# Patient Record
Sex: Female | Born: 1960 | Race: Black or African American | Hispanic: No | Marital: Single | Smoking: Never smoker
Health system: Southern US, Community
[De-identification: ages and names within clinical notes are randomized; demographics above are authoritative.]

## PROBLEM LIST (undated history)

## (undated) DIAGNOSIS — R011 Cardiac murmur, unspecified: Secondary | ICD-10-CM

## (undated) HISTORY — DX: Cardiac murmur, unspecified: R01.1

---

## 1982-03-29 HISTORY — PX: TUBAL LIGATION: SHX77

## 1997-03-29 HISTORY — PX: CARDIAC VALVE REPLACEMENT: SHX585

## 2006-02-15 ENCOUNTER — Other Ambulatory Visit: Admission: RE | Admit: 2006-02-15 | Discharge: 2006-02-15 | Payer: Self-pay | Admitting: Obstetrics and Gynecology

## 2006-03-01 ENCOUNTER — Ambulatory Visit (HOSPITAL_COMMUNITY): Admission: RE | Admit: 2006-03-01 | Discharge: 2006-03-01 | Payer: Self-pay | Admitting: Obstetrics and Gynecology

## 2006-03-14 ENCOUNTER — Encounter (INDEPENDENT_AMBULATORY_CARE_PROVIDER_SITE_OTHER): Payer: Self-pay | Admitting: *Deleted

## 2006-03-14 ENCOUNTER — Other Ambulatory Visit: Admission: RE | Admit: 2006-03-14 | Discharge: 2006-03-14 | Payer: Self-pay | Admitting: Interventional Radiology

## 2006-03-14 ENCOUNTER — Encounter: Admission: RE | Admit: 2006-03-14 | Discharge: 2006-03-14 | Payer: Self-pay | Admitting: Obstetrics and Gynecology

## 2006-10-11 ENCOUNTER — Encounter: Admission: RE | Admit: 2006-10-11 | Discharge: 2006-10-11 | Payer: Self-pay | Admitting: Endocrinology

## 2007-04-07 ENCOUNTER — Encounter: Admission: RE | Admit: 2007-04-07 | Discharge: 2007-04-07 | Payer: Self-pay | Admitting: Endocrinology

## 2007-06-30 ENCOUNTER — Emergency Department (HOSPITAL_COMMUNITY): Admission: EM | Admit: 2007-06-30 | Discharge: 2007-06-30 | Payer: Self-pay | Admitting: Family Medicine

## 2007-07-19 ENCOUNTER — Ambulatory Visit (HOSPITAL_COMMUNITY): Admission: RE | Admit: 2007-07-19 | Discharge: 2007-07-19 | Payer: Self-pay | Admitting: Orthopedic Surgery

## 2008-01-09 ENCOUNTER — Ambulatory Visit (HOSPITAL_COMMUNITY): Admission: RE | Admit: 2008-01-09 | Discharge: 2008-01-09 | Payer: Self-pay | Admitting: Obstetrics and Gynecology

## 2008-04-05 ENCOUNTER — Encounter: Admission: RE | Admit: 2008-04-05 | Discharge: 2008-04-05 | Payer: Self-pay | Admitting: Endocrinology

## 2008-12-30 ENCOUNTER — Ambulatory Visit (HOSPITAL_COMMUNITY): Admission: RE | Admit: 2008-12-30 | Discharge: 2008-12-30 | Payer: Self-pay | Admitting: Obstetrics and Gynecology

## 2008-12-30 ENCOUNTER — Encounter (INDEPENDENT_AMBULATORY_CARE_PROVIDER_SITE_OTHER): Payer: Self-pay | Admitting: Obstetrics and Gynecology

## 2009-01-09 ENCOUNTER — Ambulatory Visit (HOSPITAL_COMMUNITY): Admission: RE | Admit: 2009-01-09 | Discharge: 2009-01-09 | Payer: Self-pay | Admitting: Obstetrics and Gynecology

## 2009-08-20 IMAGING — US US SOFT TISSUE HEAD/NECK
1 series · 14 of 25 positions shown · non-contrast
Comparison: Ultrasound of 10/11/06.

CLINICAL DATA: Multinodular goiter. 
 THYROID ULTRASOUND:
TECHNIQUE: Ultrasound examination of the thyroid gland and adjacent soft tissue structures was performed.

[Series 1: us soft tissue head/neck · 0.07mm/px · 14 of 45 slices shown]
[im 1/45]
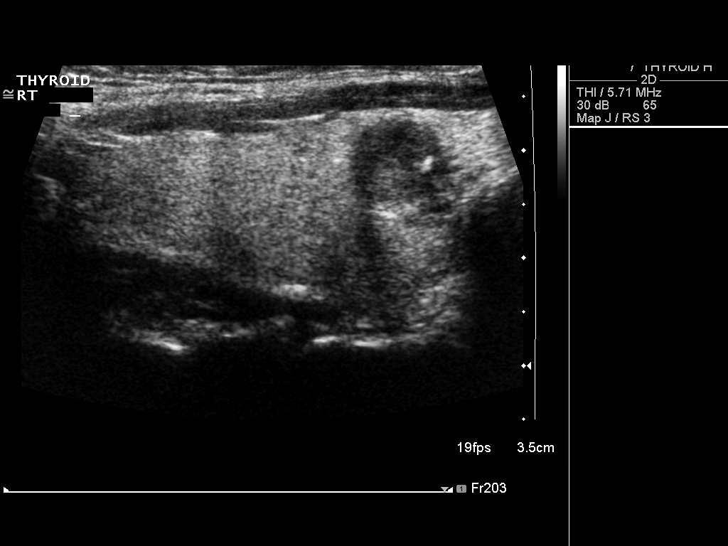
[im 4/45]
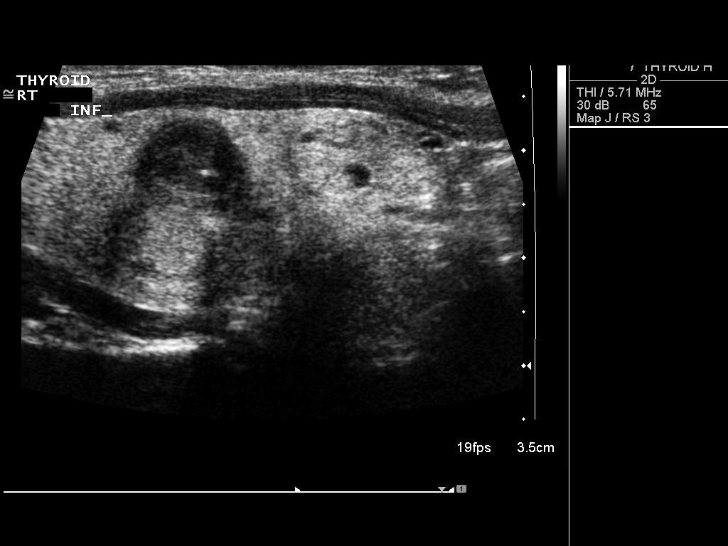
[im 8/45]
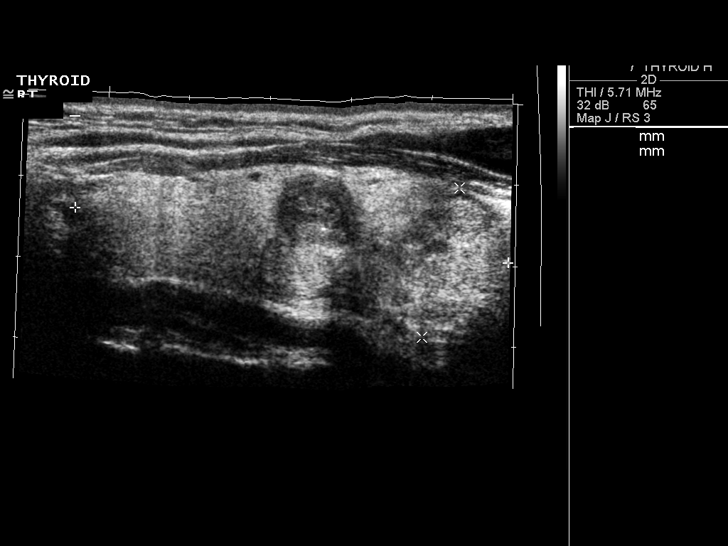
[im 12/45]
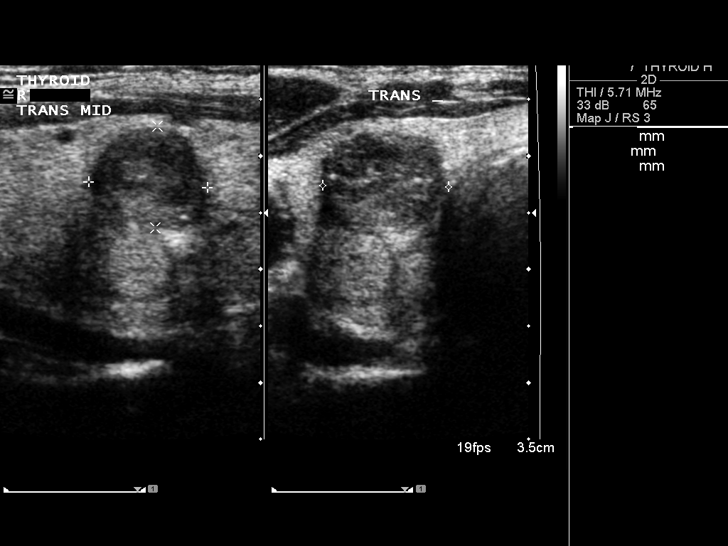
[im 15/45]
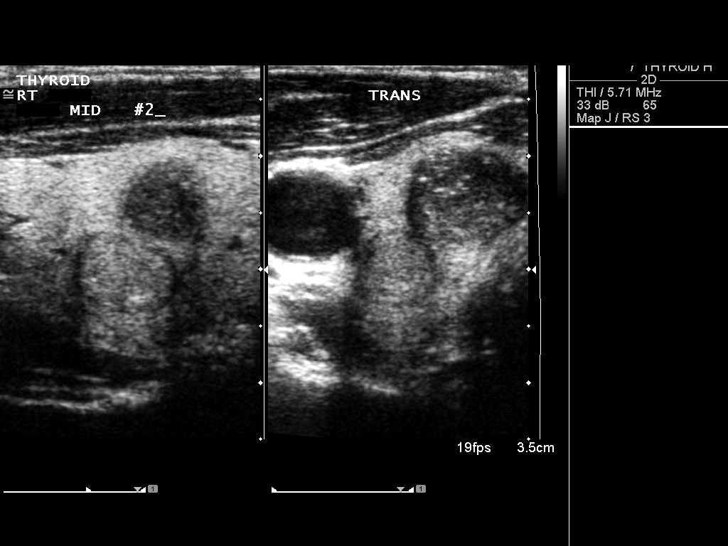
[im 17/45]
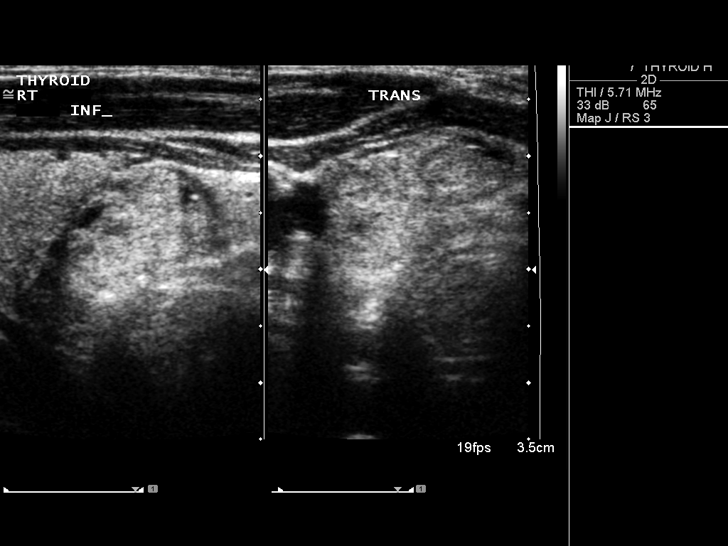
[im 21/45]
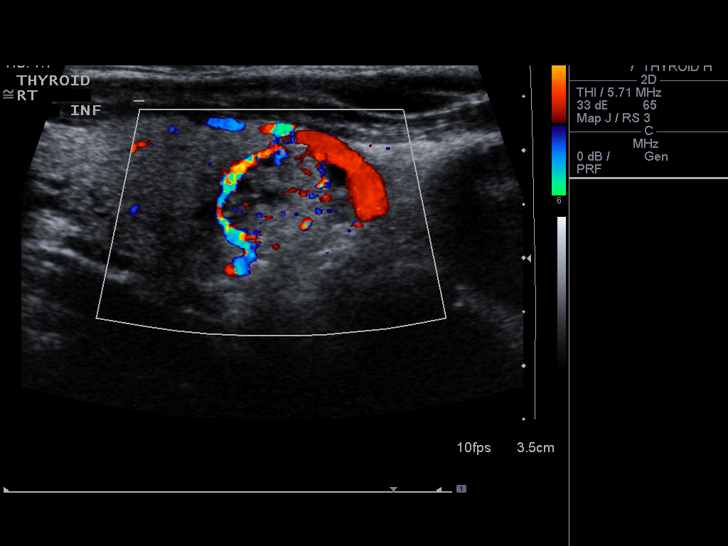
[im 24/45]
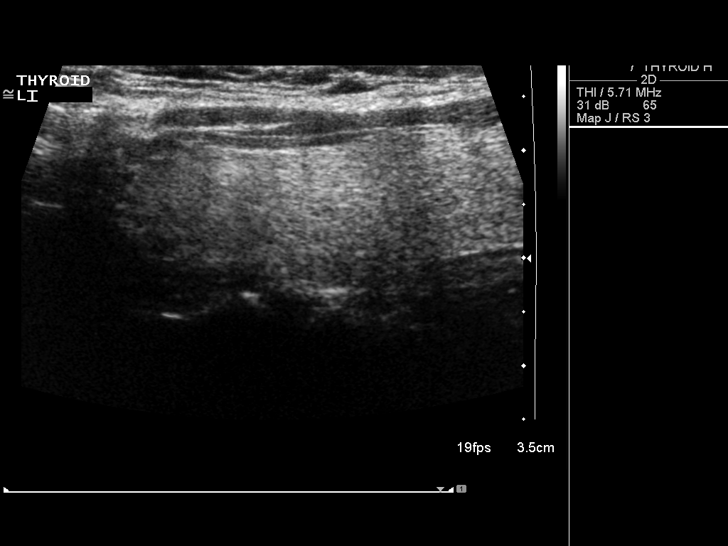
[im 28/45]
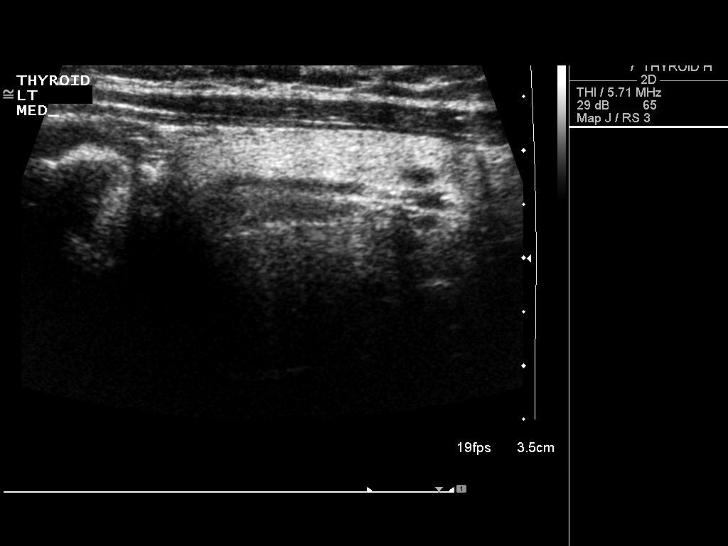
[im 30/45]
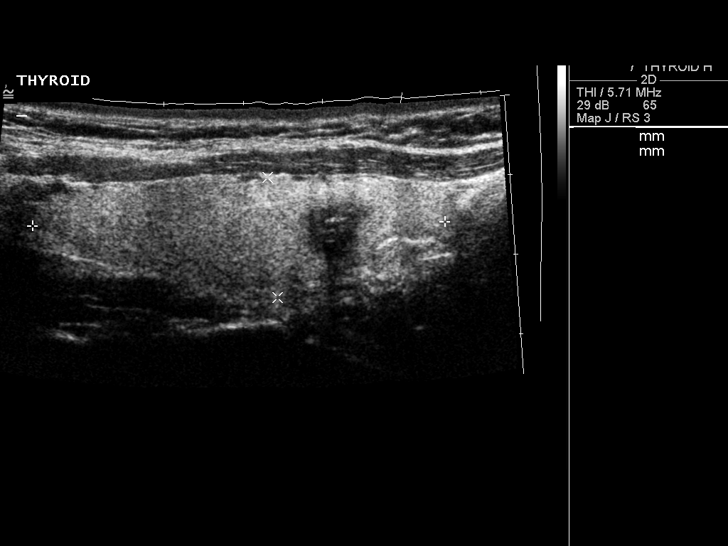
[im 34/45]
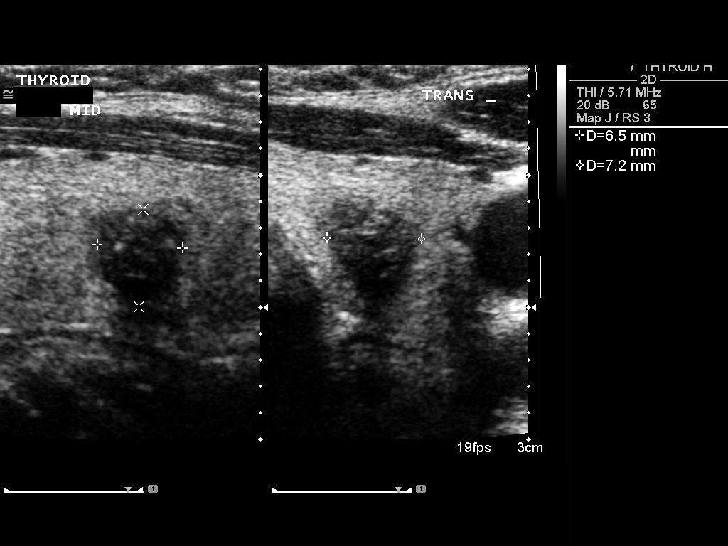
[im 37/45]
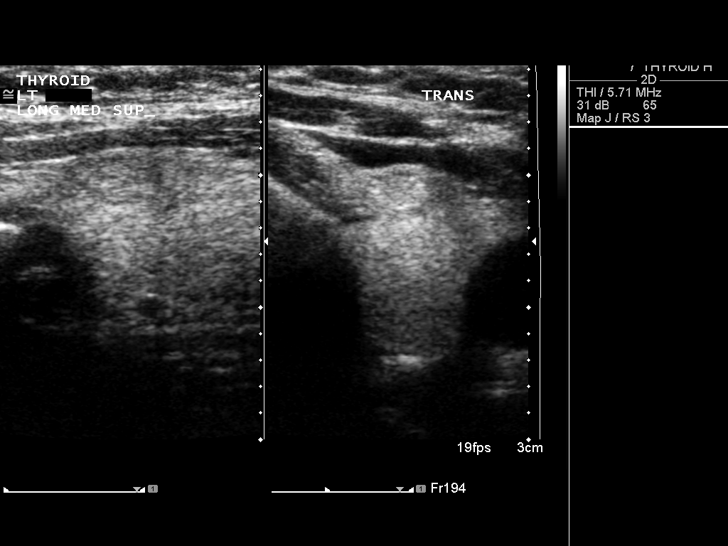
[im 41/45]
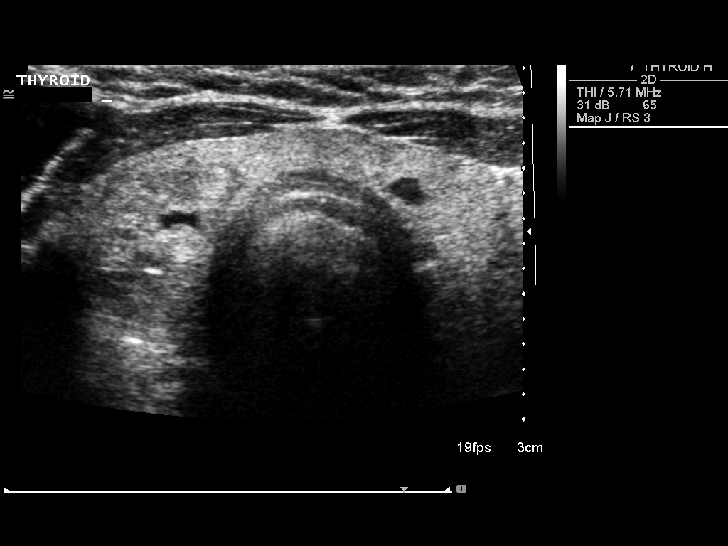
[im 45/45]
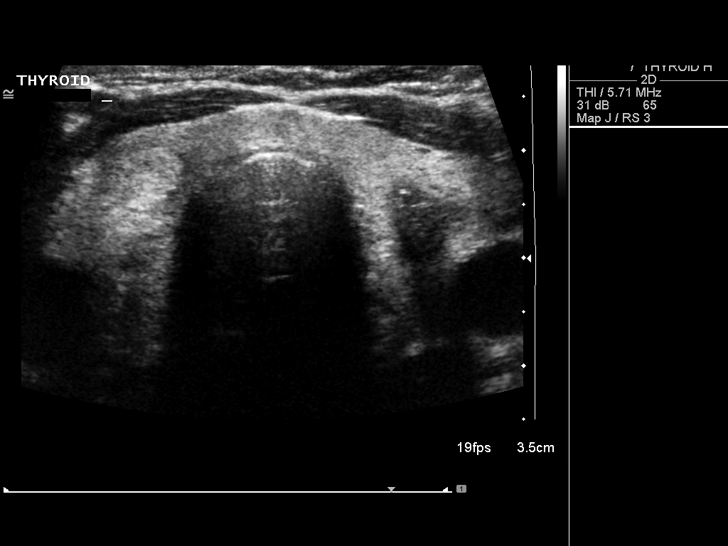

[14 of 25 positions shown; findings below may reference images not displayed]

FINDINGS: Thyroid gland is unchanged in size.  The right lobe measures 5.4 cm sagittally with a depth of 1.9 cm and width of 1.6 cm.  The left lobe measures 5.2 x 1.5 x 1.8 cm with the isthmus measuring 3 mm.  The gland is inhomogeneous and multiple nodules again are noted.  The largest nodule is noted to be within the lower pole on the right and appears slightly smaller measuring 1.4 x 0.9 x 0.9 cm.  A nodule medially in the lower pole on the left is basically unchanged measuring 1.6 x 0.7 x 1.1 cm.  Nodule in the mid lobe on the left is perhaps smaller measuring 1.1 x 0.9 x 1.1 cm.  A nodule in the midleft lobe is definitely smaller measuring 7 x 7 x 7 mm with a nodule in the upper pole on the left of 7 x 3 x 5 mm.
IMPRESSION: Nodules previously noted are perhaps slightly smaller.  No increasing nodule is seen.

## 2010-01-12 ENCOUNTER — Ambulatory Visit (HOSPITAL_COMMUNITY): Admission: RE | Admit: 2010-01-12 | Discharge: 2010-01-12 | Payer: Self-pay | Admitting: General Surgery

## 2010-01-12 ENCOUNTER — Ambulatory Visit (HOSPITAL_COMMUNITY): Admission: RE | Admit: 2010-01-12 | Discharge: 2010-01-12 | Payer: Self-pay | Admitting: Obstetrics and Gynecology

## 2010-04-19 ENCOUNTER — Encounter: Payer: Self-pay | Admitting: Endocrinology

## 2010-07-02 LAB — BASIC METABOLIC PANEL
BUN: 11 mg/dL (ref 6–23)
Calcium: 9.8 mg/dL (ref 8.4–10.5)
Chloride: 101 mEq/L (ref 96–112)
Creatinine, Ser: 0.89 mg/dL (ref 0.4–1.2)
GFR calc Af Amer: >60 mL/min (ref >60)
GFR calc non Af Amer: >60 mL/min (ref >60)

## 2010-07-02 LAB — CBC
MCV: 88.9 fL (ref 78.0–100.0)
Platelets: 243 10*3/uL (ref 150–400)
RBC: 4.65 MIL/uL (ref 3.87–5.11)
WBC: 4.2 10*3/uL (ref 4.0–10.5)

## 2010-07-02 LAB — PREGNANCY, URINE: Preg Test, Ur: NEGATIVE

## 2010-08-19 IMAGING — US US SOFT TISSUE HEAD/NECK
1 series · 14 of 25 positions shown · non-contrast
Comparison: Ultrasound soft tissue head neck of 04/07/2007

CLINICAL DATA: Follow up of thyroid nodules

THYROID ULTRASOUND
TECHNIQUE: Ultrasound examination of the thyroid gland and
adjacent soft tissues was performed.

[Series 1: us soft tissue head/neck · 0.08mm/px · 14 of 62 slices shown]
[im 1/62]
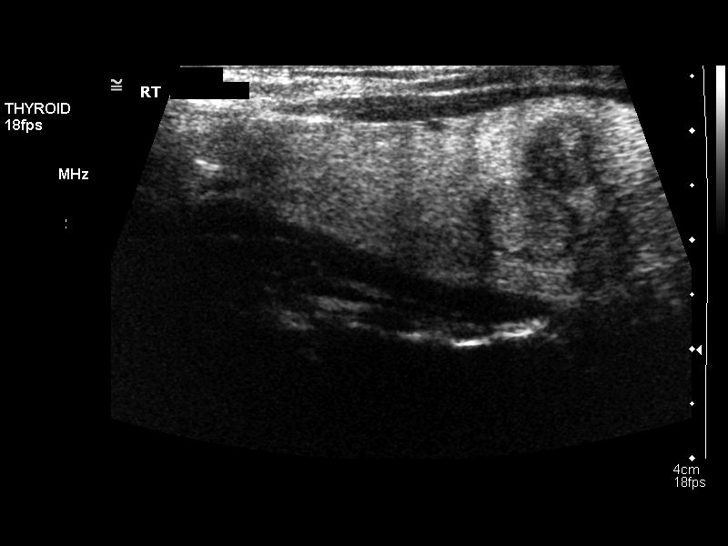
[im 6/62]
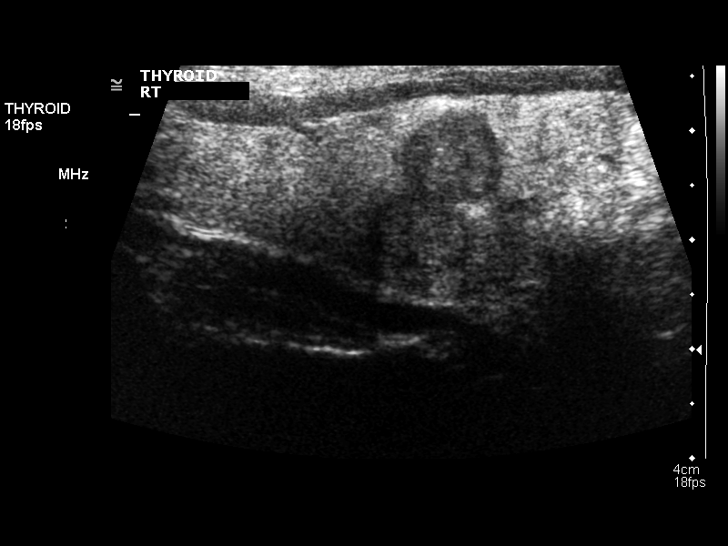
[im 11/62]
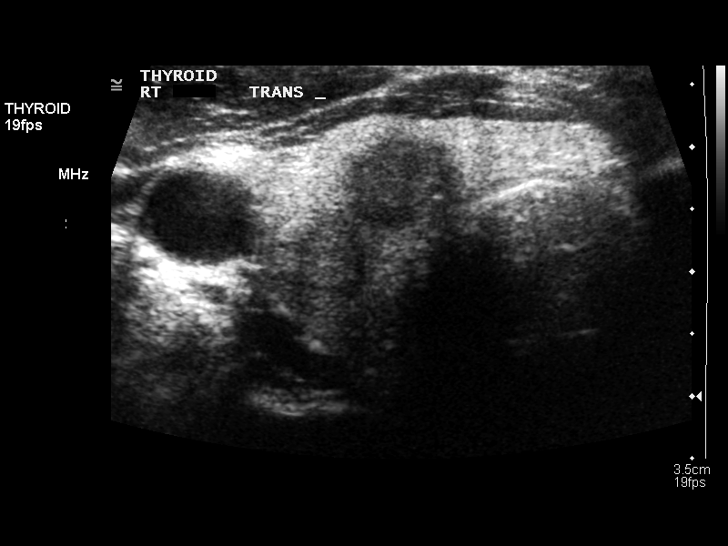
[im 16/62]
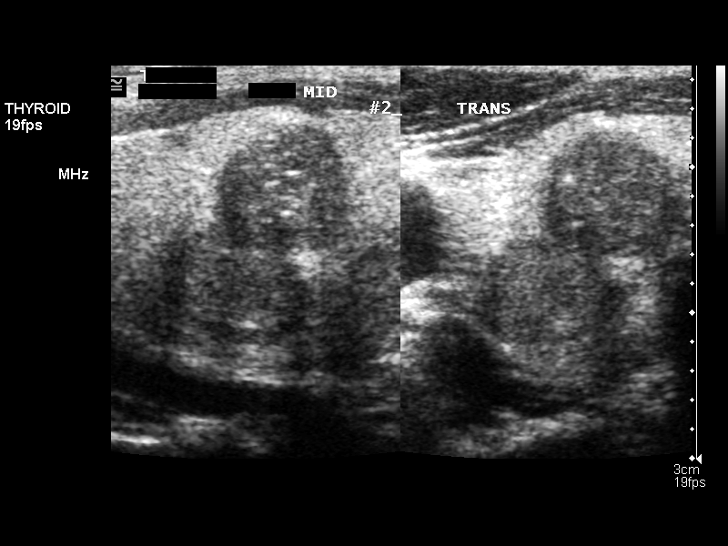
[im 21/62]
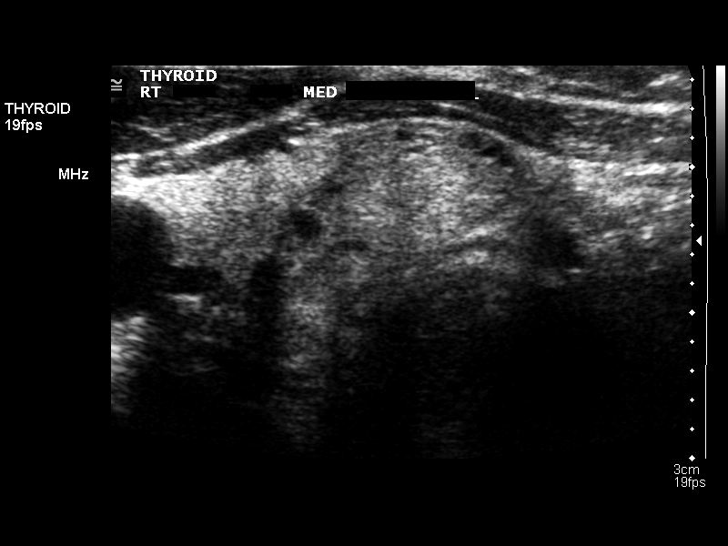
[im 23/62]
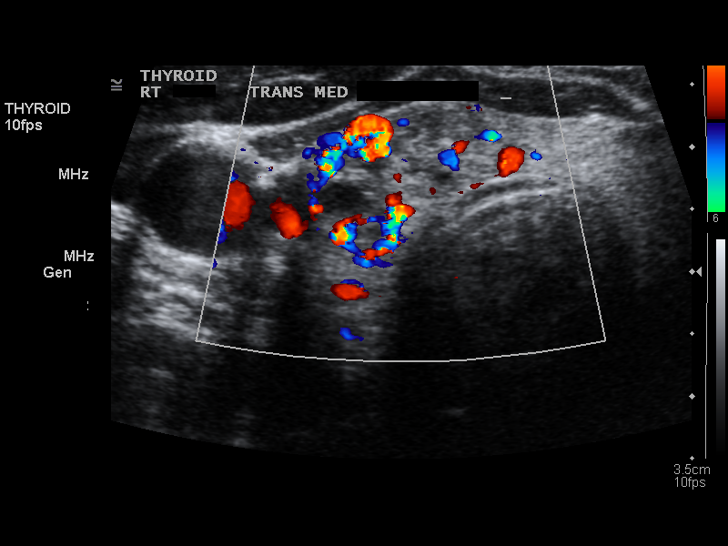
[im 28/62]
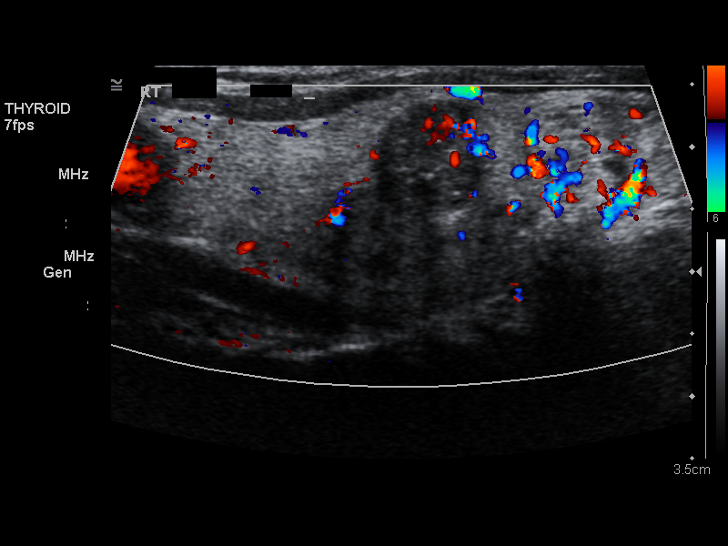
[im 34/62]
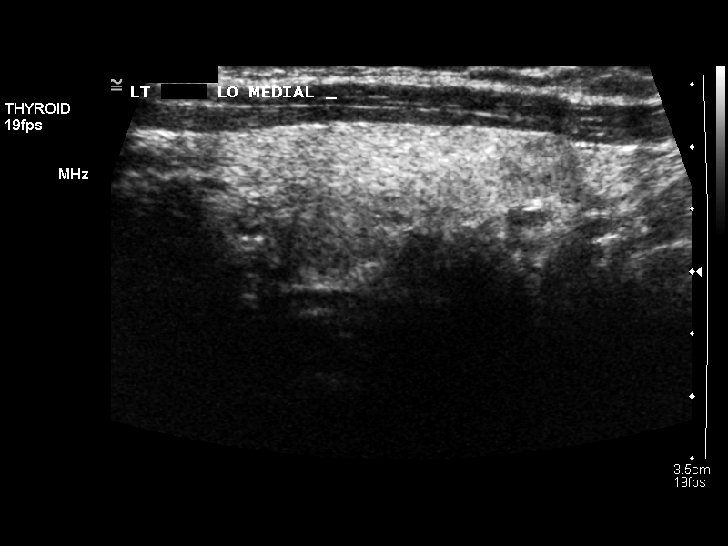
[im 39/62]
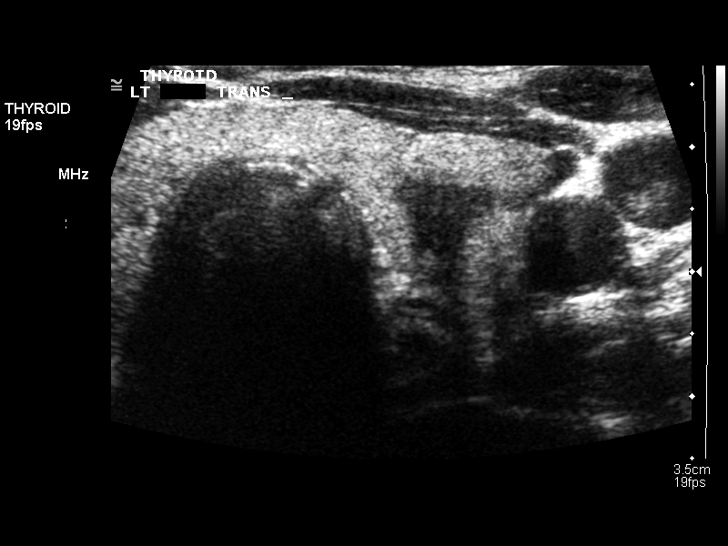
[im 41/62]
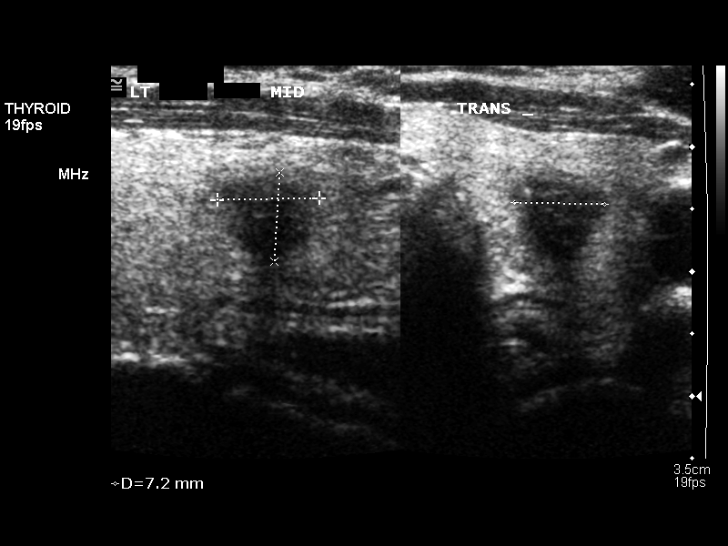
[im 46/62]
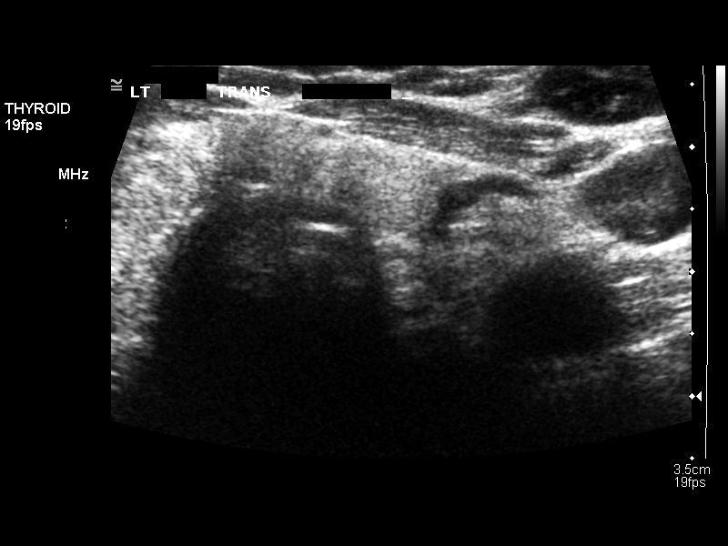
[im 51/62]
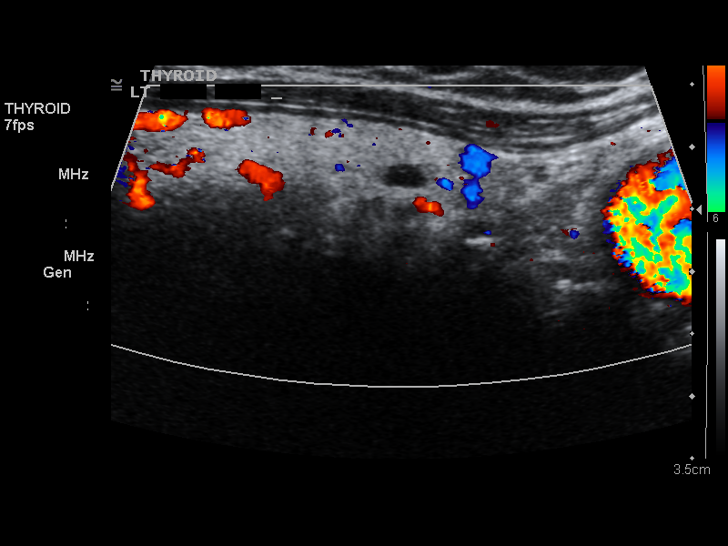
[im 56/62]
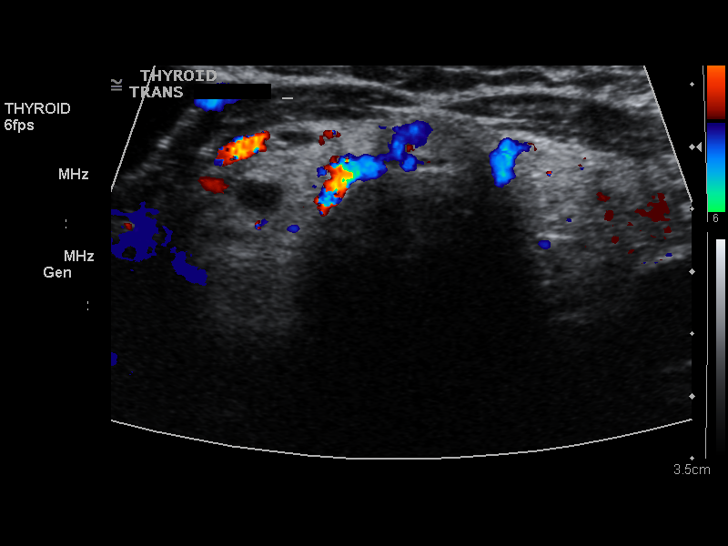
[im 62/62]
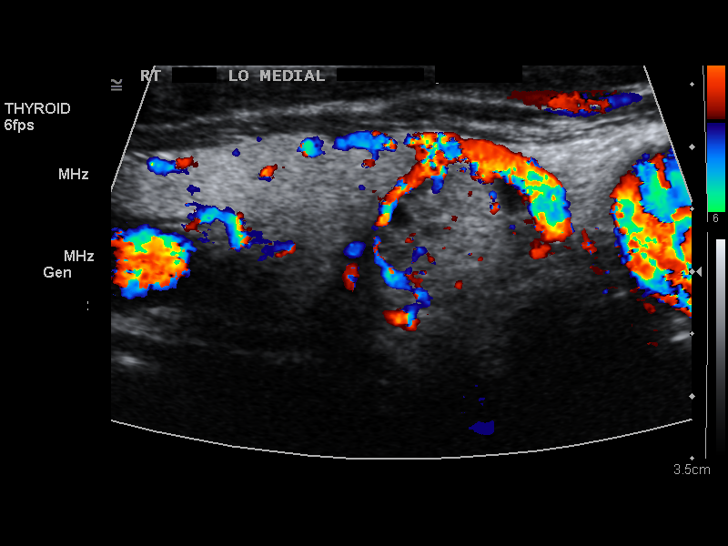

[14 of 25 positions shown; findings below may reference images not displayed]

FINDINGS: The thyroid gland is stable in size.  The right lobe
measures 5.7 cm sagittally, with a depth of 1.9 cm and width of
cm.  The left lobe measures 5.5 x 1.6 x 1.9 cm, with the isthmus
measuring 4 mm.  The gland is inhomogeneous and there are nodules
bilaterally.  The dominant nodule is in the lower pole on the right
and is solid measuring 1.8 x 0.9 x 1.2 cm with a second adjacent
medial nodule of 1.5 x 0.8 x 1.3 cm.  These nodules are relatively
stable compared to the prior ultrasound.  An additional nodule is
noted in the right mid lobe of 10 x 9 x 10 mm, and is unchanged.  A
nodule the left mid lobe measures 8 x 7 x 7 mm, previously
measuring 7 x 7 x 7 mm.  There are a few shadowing calcifications
within this nodule.
IMPRESSION: The previously noted thyroid nodules appear stable as noted above.
No new nodule is seen.

## 2011-01-28 ENCOUNTER — Other Ambulatory Visit (HOSPITAL_COMMUNITY): Payer: Self-pay | Admitting: Obstetrics and Gynecology

## 2011-01-28 DIAGNOSIS — Z1231 Encounter for screening mammogram for malignant neoplasm of breast: Secondary | ICD-10-CM

## 2011-02-02 ENCOUNTER — Ambulatory Visit (HOSPITAL_COMMUNITY)
Admission: RE | Admit: 2011-02-02 | Discharge: 2011-02-02 | Disposition: A | Discharge status: Home / Self Care | Payer: 59 | Source: Ambulatory Visit | Attending: Obstetrics and Gynecology | Admitting: Obstetrics and Gynecology

## 2011-02-02 DIAGNOSIS — Z1231 Encounter for screening mammogram for malignant neoplasm of breast: Secondary | ICD-10-CM | POA: Insufficient documentation

## 2011-02-03 ENCOUNTER — Other Ambulatory Visit: Payer: Self-pay | Admitting: Obstetrics and Gynecology

## 2011-02-03 DIAGNOSIS — R928 Other abnormal and inconclusive findings on diagnostic imaging of breast: Secondary | ICD-10-CM

## 2011-02-23 ENCOUNTER — Ambulatory Visit
Admission: RE | Admit: 2011-02-23 | Discharge: 2011-02-23 | Disposition: A | Discharge status: Home / Self Care | Payer: 59 | Source: Ambulatory Visit | Attending: Obstetrics and Gynecology | Admitting: Obstetrics and Gynecology

## 2011-02-23 DIAGNOSIS — R928 Other abnormal and inconclusive findings on diagnostic imaging of breast: Secondary | ICD-10-CM

## 2013-07-02 ENCOUNTER — Ambulatory Visit (INDEPENDENT_AMBULATORY_CARE_PROVIDER_SITE_OTHER): Payer: BC Managed Care – PPO | Admitting: Physician Assistant

## 2013-07-02 VITALS — BP 122/80 | HR 69 | Temp 97.8°F | Resp 18 | Ht 65.5 in | Wt 167.0 lb

## 2013-07-02 DIAGNOSIS — J329 Chronic sinusitis, unspecified: Secondary | ICD-10-CM

## 2013-07-02 DIAGNOSIS — R059 Cough, unspecified: Secondary | ICD-10-CM

## 2013-07-02 DIAGNOSIS — R05 Cough: Secondary | ICD-10-CM

## 2013-07-02 DIAGNOSIS — R0981 Nasal congestion: Secondary | ICD-10-CM

## 2013-07-02 DIAGNOSIS — J3489 Other specified disorders of nose and nasal sinuses: Secondary | ICD-10-CM

## 2013-07-02 MED ORDER — AMOXICILLIN-POT CLAVULANATE 875-125 MG PO TABS: 1.0000 | ORAL_TABLET | Freq: Two times a day (BID) | ORAL | Status: DC

## 2013-07-02 MED ORDER — GUAIFENESIN ER 1200 MG PO TB12: 1.0000 | ORAL_TABLET | Freq: Two times a day (BID) | ORAL | Status: DC | PRN

## 2013-07-02 MED ORDER — HYDROCOD POLST-CHLORPHEN POLST 10-8 MG/5ML PO LQCR: 5.0000 mL | Freq: Two times a day (BID) | ORAL | Status: DC | PRN

## 2013-07-02 NOTE — Progress Notes (Signed)
   Subjective:    Patient ID: Erin Paul, female    DOB: [birth year], 53 y.o.   MRN: [id number]  HPI 53 year old female presents for evaluation of 1 week history of nasal congestion, sinus pressure, facial pain, PND, and a productive cough. Symptoms have been progressively worsening despite treatment with OTC Nyquil and Theraflu which has not helped much.  Admits to facial pain with associated dental pain bilateral upper jaw.  Has thick brownish yellow nasal discharge and mucous.  Cough is productive of this sputum. No fever, chills, nausea, vomiting, chest pain, SOB, wheezing, otalgia, or sore throat.  Does have a lot of PND.   Patient is otherwise healthy with no other concerns today.  She works as an Charity fundraiserN at [employer].     Review of Systems  Constitutional: Negative for fever and chills.  HENT: Positive for congestion, postnasal drip, rhinorrhea and sinus pressure. Negative for ear pain and sore throat.   Respiratory: Positive for cough. Negative for chest tightness, shortness of breath and wheezing.   Cardiovascular: Negative for chest pain.  Gastrointestinal: Negative for nausea and vomiting.  Neurological: Negative for dizziness and headaches.       Objective:   Physical Exam  Constitutional: She is oriented to person, place, and time. She appears well-developed and well-nourished.  HENT:  Head: Normocephalic and atraumatic.  Right Ear: Hearing, tympanic membrane, external ear and ear canal normal.  Left Ear: Hearing, tympanic membrane, external ear and ear canal normal.  Nose: Right sinus exhibits maxillary sinus tenderness. Right sinus exhibits no frontal sinus tenderness. Left sinus exhibits maxillary sinus tenderness. Left sinus exhibits no frontal sinus tenderness.  Mouth/Throat: Uvula is midline, oropharynx is clear and moist and mucous membranes are normal.  Eyes: Conjunctivae are normal.  Neck: Normal range of motion. Neck supple.  Cardiovascular: Normal rate,  regular rhythm and normal heart sounds.   Pulmonary/Chest: Effort normal and breath sounds normal.  Lymphadenopathy:    She has no cervical adenopathy.  Neurological: She is alert and oriented to person, place, and time.  Psychiatric: She has a normal mood and affect. Her behavior is normal. Judgment and thought content normal.          Assessment & Plan:  Sinus infection - Plan: amoxicillin-clavulanate (AUGMENTIN) 875-125 MG per tablet  Cough - Plan: chlorpheniramine-HYDROcodone (TUSSIONEX PENNKINETIC ER) 10-8 MG/5ML LQCR  Nasal congestion - Plan: Guaifenesin (MUCINEX MAXIMUM STRENGTH) 1200 MG TB12  Will treat for sinusitis with Augmentin 875 mg bid x 10 days Tussionex qhs prn cough - caution sedation Mucinex q12hours to help with congestion Increase fluids and rest RTC precautions discussed.  Follow up if symptoms worsen or fail to improve.

## 2014-08-28 ENCOUNTER — Telehealth: Payer: Self-pay | Admitting: Hematology

## 2014-08-28 NOTE — Telephone Encounter (Signed)
NEW PATIENT APPT- S/W PATIENT AND GAVE NP APPT FOR 06/24 @ 10:30 W/DR. FENG REFERRING DR. Hewitt ShortsAMMIE FULP DX-LEUKOPENIA

## 2014-09-20 ENCOUNTER — Ambulatory Visit (HOSPITAL_BASED_OUTPATIENT_CLINIC_OR_DEPARTMENT_OTHER): Payer: BLUE CROSS/BLUE SHIELD | Admitting: Hematology

## 2014-09-20 ENCOUNTER — Telehealth: Payer: Self-pay | Admitting: Hematology

## 2014-09-20 ENCOUNTER — Encounter: Payer: Self-pay | Admitting: Hematology

## 2014-09-20 ENCOUNTER — Ambulatory Visit (HOSPITAL_BASED_OUTPATIENT_CLINIC_OR_DEPARTMENT_OTHER): Payer: BLUE CROSS/BLUE SHIELD

## 2014-09-20 VITALS — BP 150/83 | HR 65 | Temp 98.4°F | Resp 20 | Ht 65.0 in | Wt 163.0 lb

## 2014-09-20 DIAGNOSIS — D709 Neutropenia, unspecified: Secondary | ICD-10-CM

## 2014-09-20 DIAGNOSIS — D72819 Decreased white blood cell count, unspecified: Secondary | ICD-10-CM

## 2014-09-20 DIAGNOSIS — I069 Rheumatic aortic valve disease, unspecified: Secondary | ICD-10-CM

## 2014-09-20 LAB — COMPREHENSIVE METABOLIC PANEL (CC13)
ALBUMIN: 4.2 g/dL (ref 3.5–5.0)
ALT: 17 U/L (ref 0–55)
ANION GAP: 7 meq/L (ref 3–11)
AST: 20 U/L (ref 5–34)
Alkaline Phosphatase: 74 U/L (ref 40–150)
BUN: 10.7 mg/dL (ref 7.0–26.0)
CO2: 31 meq/L — AB (ref 22–29)
Calcium: 9.5 mg/dL (ref 8.4–10.4)
Chloride: 103 mEq/L (ref 98–109)
Creatinine: 0.8 mg/dL (ref 0.6–1.1)
Glucose: 80 mg/dl (ref 70–140)
POTASSIUM: 3.5 meq/L (ref 3.5–5.1)
SODIUM: 141 meq/L (ref 136–145)
TOTAL PROTEIN: 7.8 g/dL (ref 6.4–8.3)
Total Bilirubin: 0.58 mg/dL (ref 0.20–1.20)

## 2014-09-20 LAB — CBC WITH DIFFERENTIAL/PLATELET
BASO%: 0.3 % (ref 0.0–2.0)
Basophils Absolute: 0 10*3/uL (ref 0.0–0.1)
EOS ABS: 0.1 10*3/uL (ref 0.0–0.5)
EOS%: 2.9 % (ref 0.0–7.0)
HCT: 42.9 % (ref 34.8–46.6)
HGB: 14.4 g/dL (ref 11.6–15.9)
LYMPH%: 35.4 % (ref 14.0–49.7)
MCH: 30.3 pg (ref 25.1–34.0)
MCHC: 33.7 g/dL (ref 31.5–36.0)
MCV: 89.9 fL (ref 79.5–101.0)
MONO#: 0.3 10*3/uL (ref 0.1–0.9)
MONO%: 10.1 % (ref 0.0–14.0)
NEUT%: 51.3 % (ref 38.4–76.8)
NEUTROS ABS: 1.4 10*3/uL — AB (ref 1.5–6.5)
PLATELETS: 228 10*3/uL (ref 145–400)
RBC: 4.77 10*6/uL (ref 3.70–5.45)
RDW: 12.8 % (ref 11.2–14.5)
WBC: 2.8 10*3/uL — ABNORMAL LOW (ref 3.9–10.3)
lymph#: 1 10*3/uL (ref 0.9–3.3)

## 2014-09-20 LAB — MORPHOLOGY: PLT EST: ADEQUATE

## 2014-09-20 LAB — LACTATE DEHYDROGENASE (CC13): LDH: 274 U/L — AB (ref 125–245)

## 2014-09-20 NOTE — Progress Notes (Signed)
[place]  Telephone:(336) [phone] Fax:(336) [phone]  Clinic New Consult Note   Patient Care Team: Antony Blackbird, MD as PCP - General (Family Medicine) 09/20/2014  CHIEF COMPLAINTS/PURPOSE OF CONSULTATION:  Leukopenia   HISTORY OF PRESENTING ILLNESS:  Erin Paul 54 y.o. female is here because of leukopenia.   She had normal CBC on 12/27/2008, which showed a WBC 4.2, hemoglobin 13.9 and platelet count 243. She had a routine lab work in April 2016 by her primary care physician, which showed WBC 3.1, with normal differential and absolute neutrophil 1.6, the rest of CBC was normal. Repeated CBC on 08/10/2014 again showed WBC 3.1, absolute neutrophil 1.3.  She was referred to [place] for further evaluation.  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 No history of herart feliure.   She is otherwise healthy and active, no other complains. She denies any significant joint swelling or pain, no skin rash, no chest pain, cough, abdominal symptoms or GU symptoms. She has good appetite and energy level, no recent weight loss, fever, chills or night sweats.  MEDICAL HISTORY:  Past Medical History  Diagnosis Date  . Heart murmur     SURGICAL HISTORY: Past Surgical History  Procedure Laterality Date  . Tubal ligation  1984  . Cardiac valve replacement  1999    SOCIAL HISTORY: History   Social History  . Marital Status: Single    Spouse Name: N/A  . Number of Children: 4 adult children   . Years of Education: N/A   Occupational History  . Not on file.   Social History Main Topics  . Smoking status: Never Smoker   . Smokeless tobacco: Not on file  . Alcohol Use: Yes     Comment: wine 3 times a week  . Drug Use: No  . Sexual Activity: Not on file   Other Topics Concern  . Not on file   Social History Narrative  . No narrative on file    FAMILY HISTORY: Family History  Problem  Relation Age of Onset  . Diabetes Father   . Heart disease Sister     ALLERGIES:  has No Known Allergies.  MEDICATIONS:  Current Outpatient Prescriptions  Medication Sig Dispense Refill  . amLODipine (NORVASC) 5 MG tablet Take 5 mg by mouth daily.    Marland Kitchen amoxicillin-clavulanate (AUGMENTIN) 875-125 MG per tablet Take 1 tablet by mouth 2 (two) times daily. 20 tablet 0  . chlorpheniramine-HYDROcodone (TUSSIONEX PENNKINETIC ER) 10-8 MG/5ML LQCR Take 5 mLs by mouth every 12 (twelve) hours as needed for cough (cough). 60 mL 0  . Guaifenesin (MUCINEX MAXIMUM STRENGTH) 1200 MG TB12 Take 1 tablet (1,200 mg total) by mouth every 12 (twelve) hours as needed. 14 tablet 0  . warfarin (COUMADIN) 5 MG tablet Take 5 mg by mouth daily.     No current facility-administered medications for this visit.    REVIEW OF SYSTEMS:   Constitutional: Denies fevers, chills or abnormal night sweats Eyes: Denies blurriness of vision, double vision or watery eyes Ears, nose, mouth, throat, and face: Denies mucositis or sore throat Respiratory: Denies cough, dyspnea or wheezes Cardiovascular: Denies palpitation, chest discomfort or lower extremity swelling Gastrointestinal:  Denies nausea, heartburn or change in bowel habits Skin: Denies abnormal skin rashes Lymphatics: Denies new lymphadenopathy or easy bruising Neurological:Denies numbness, tingling or new weaknesses Behavioral/Psych: Mood is stable, no new changes  All other systems were reviewed with the patient  and are negative.  PHYSICAL EXAMINATION: ECOG PERFORMANCE STATUS: 0 - Asymptomatic  Filed Vitals:   09/20/14 1109  BP: 150/83  Pulse: 65  Temp: 98.4 F (36.9 C)  Resp: 20   Filed Weights   09/20/14 1109  Weight: 163 lb (73.936 kg)    GENERAL:alert, no distress and comfortable SKIN: skin color, texture, turgor are normal, no rashes or significant lesions EYES: normal, conjunctiva are pink and non-injected, sclera clear OROPHARYNX:no  exudate, no erythema and lips, buccal mucosa, and tongue normal  NECK: supple, thyroid normal size, non-tender, without nodularity LYMPH:  no palpable lymphadenopathy in the cervical, axillary or inguinal LUNGS: clear to auscultation and percussion with normal breathing effort HEART: regular rate & rhythm and no murmurs and no lower extremity edema ABDOMEN:abdomen soft, non-tender and normal bowel sounds Musculoskeletal:no cyanosis of digits and no clubbing  PSYCH: alert & oriented x 3 with fluent speech NEURO: no focal motor/sensory deficits  LABORATORY DATA:  I have reviewed the data as listed Lab Results  Component Value Date   WBC 4.2 12/27/2008   HGB 13.9 12/27/2008   HCT 41.3 12/27/2008   MCV 88.9 12/27/2008   PLT 243 12/27/2008   No results for input(s): NA, K, CL, CO2, GLUCOSE, BUN, CREATININE, CALCIUM, GFRNONAA, GFRAA, PROT, ALBUMIN, AST, ALT, ALKPHOS, BILITOT, BILIDIR, IBILI in the last 8760 hours.  RADIOGRAPHIC STUDIES: I have personally reviewed the radiological images as listed and agreed with the findings in the report. No results found.  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  1. Mild leukopenia -His WBC has been slightly low, with mild neutropenia. The rest of CBC were normal. -We discussed the possible etiology of neutropenia, such as virus infection (acute or chronic, such as hepatitis B or C, HIV), benign ethnic neutropenia, medication-induced neutropenia, liver cirrhosis and hypersplenism, autoimmune neutropenia, PNH, megaloblastic anemia, MDS, leukemia, etc.  -Giving his mild neutropenia, normal hemoglobin and platelet, this is unlikely a primary bone marrow disease -I recommend to have the following lab done today, CBC with differential, CMP, ANA, rheumatoid factor, ANCA,  sedimentation rate, folic RBC, T53 -We'll obtain a  abdominal ultrasound to evaluate her liver and spleen nausea -If the above test are unrevealing, I recommend clinical monitoring her CBC. I'll hold on bone marrow biopsy unless her neutropenia gets worse or she develops other cytopenia.  2. History of rheumatic fever and rheumatic valve disease, status post aortic valve replacement -She'll continue follow-up with her cardiologist  -she is on Coumadin  Follow-up: -Lab today, abdominal ultrasound in the next few weeks -I'll see her back in 3 months with repeated CBC and diff    All questions were answered. The patient knows to call the clinic with any problems, questions or concerns. I spent 40 minutes counseling the patient face to face. The total time spent in the appointment was 55 minutes and more than 50% was on counseling.     Truitt Merle, MD 09/20/2014 11:50 AM

## 2014-09-20 NOTE — Telephone Encounter (Signed)
Appointments made and avs printed for patient °

## 2014-09-23 LAB — ANCA SCREEN W REFLEX TITER: ANCA SCREEN: NEGATIVE

## 2014-09-23 LAB — VITAMIN B12: VITAMIN B 12: 379 pg/mL (ref 211–911)

## 2014-09-23 LAB — ANA: ANA: POSITIVE — AB

## 2014-09-23 LAB — RHEUMATOID FACTOR: Rhuematoid fact SerPl-aCnc: <10 IU/mL (ref <=14)

## 2014-09-23 LAB — SEDIMENTATION RATE: Sed Rate: 4 mm/hr (ref 0–30)

## 2014-09-23 LAB — ANTI-NUCLEAR AB-TITER (ANA TITER): ANA Titer 1: 1:160 {titer} — ABNORMAL HIGH

## 2014-09-23 LAB — FOLATE RBC: RBC Folate: 669 ng/mL (ref >280)

## 2014-09-26 ENCOUNTER — Ambulatory Visit (HOSPITAL_COMMUNITY)
Admission: RE | Admit: 2014-09-26 | Discharge: 2014-09-26 | Disposition: A | Discharge status: Home / Self Care | Payer: BLUE CROSS/BLUE SHIELD | Source: Ambulatory Visit | Attending: Hematology | Admitting: Hematology

## 2014-09-26 DIAGNOSIS — D72819 Decreased white blood cell count, unspecified: Secondary | ICD-10-CM | POA: Insufficient documentation

## 2014-12-20 ENCOUNTER — Telehealth: Payer: Self-pay | Admitting: Hematology

## 2014-12-20 ENCOUNTER — Other Ambulatory Visit (HOSPITAL_BASED_OUTPATIENT_CLINIC_OR_DEPARTMENT_OTHER): Payer: BLUE CROSS/BLUE SHIELD

## 2014-12-20 ENCOUNTER — Ambulatory Visit (HOSPITAL_BASED_OUTPATIENT_CLINIC_OR_DEPARTMENT_OTHER): Payer: BLUE CROSS/BLUE SHIELD | Admitting: Hematology

## 2014-12-20 ENCOUNTER — Encounter: Payer: Self-pay | Admitting: Hematology

## 2014-12-20 ENCOUNTER — Ambulatory Visit: Payer: BLUE CROSS/BLUE SHIELD

## 2014-12-20 VITALS — BP 148/84 | HR 69 | Temp 98.4°F | Resp 18 | Ht 65.0 in | Wt 168.5 lb

## 2014-12-20 DIAGNOSIS — D72819 Decreased white blood cell count, unspecified: Secondary | ICD-10-CM

## 2014-12-20 LAB — CBC WITH DIFFERENTIAL/PLATELET
BASO%: 0.6 % (ref 0.0–2.0)
Basophils Absolute: 0 10*3/uL (ref 0.0–0.1)
EOS%: 4.2 % (ref 0.0–7.0)
Eosinophils Absolute: 0.2 10*3/uL (ref 0.0–0.5)
HCT: 41.7 % (ref 34.8–46.6)
HGB: 14 g/dL (ref 11.6–15.9)
LYMPH%: 27.9 % (ref 14.0–49.7)
MCH: 30.3 pg (ref 25.1–34.0)
MCHC: 33.6 g/dL (ref 31.5–36.0)
MCV: 90.2 fL (ref 79.5–101.0)
MONO#: 0.4 10*3/uL (ref 0.1–0.9)
MONO%: 10.8 % (ref 0.0–14.0)
NEUT%: 56.5 % (ref 38.4–76.8)
NEUTROS ABS: 2.1 10*3/uL (ref 1.5–6.5)
Platelets: 223 10*3/uL (ref 145–400)
RBC: 4.62 10*6/uL (ref 3.70–5.45)
RDW: 12.8 % (ref 11.2–14.5)
WBC: 3.8 10*3/uL — AB (ref 3.9–10.3)
lymph#: 1.1 10*3/uL (ref 0.9–3.3)

## 2014-12-20 NOTE — Telephone Encounter (Signed)
GAVE ADN PRINTED APPT SCHED AND AVS FOR PT FOR mARCH 2017 °

## 2014-12-20 NOTE — Progress Notes (Signed)
[place]  Telephone:(336) [phone] Fax:(336) [phone]  Clinic follow up Note   Patient Care Team: Antony Blackbird, MD as PCP - General (Family Medicine) 12/20/2014  CHIEF COMPLAINTS/PURPOSE OF CONSULTATION:  Leukopenia   HISTORY OF PRESENTING ILLNESS:  Erin Paul 54 y.o. female is here because of leukopenia.   She had normal CBC on 12/27/2008, which showed a WBC 4.2, hemoglobin 13.9 and platelet count 243. She had a routine lab work in April 2016 by her primary care physician, which showed WBC 3.1, with normal differential and absolute neutrophil 1.6, the rest of CBC was normal. Repeated CBC on 08/10/2014 again showed WBC 3.1, absolute neutrophil 1.3.  She was referred to [place] for further evaluation.  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 No history of herart feliure.   She is otherwise healthy and active, no other complains. She denies any significant joint swelling or pain, no skin rash, no chest pain, cough, abdominal symptoms or GU symptoms. She has good appetite and energy level, no recent weight loss, fever, chills or night sweats.  INTERIM HISTORY Erin Paul returns for follow-up. She has been doing well since I saw her 3 months ago. She denies an episode of fever, chills, or infection. She is very active, feels well, no complaints.  MEDICAL HISTORY:  Past Medical History  Diagnosis Date  . Heart murmur     SURGICAL HISTORY: Past Surgical History  Procedure Laterality Date  . Tubal ligation  1984  . Cardiac valve replacement  1999    SOCIAL HISTORY: History   Social History  . Marital Status: Single    Spouse Name: N/A  . Number of Children: 4 adult children   . Years of Education: N/A   Occupational History  . Not on file.   Social History Main Topics  . Smoking status: Never Smoker   . Smokeless tobacco: Not on file  . Alcohol Use: Yes     Comment: wine 3 times a  week  . Drug Use: No  . Sexual Activity: Not on file   Other Topics Concern  . Not on file   Social History Narrative  . No narrative on file    FAMILY HISTORY: Family History  Problem Relation Age of Onset  . Diabetes Father   . Heart disease Father   . Heart disease Sister   . Hypertension Mother   . Cancer Maternal Grandmother     multiple myeloma     ALLERGIES:  has No Known Allergies.  MEDICATIONS:  Current Outpatient Prescriptions  Medication Sig Dispense Refill  . amLODipine (NORVASC) 5 MG tablet Take 5 mg by mouth daily.    Marland Kitchen warfarin (COUMADIN) 5 MG tablet Take 5 mg by mouth daily.     No current facility-administered medications for this visit.    REVIEW OF SYSTEMS:   Constitutional: Denies fevers, chills or abnormal night sweats Eyes: Denies blurriness of vision, double vision or watery eyes Ears, nose, mouth, throat, and face: Denies mucositis or sore throat Respiratory: Denies cough, dyspnea or wheezes Cardiovascular: Denies palpitation, chest discomfort or lower extremity swelling Gastrointestinal:  Denies nausea, heartburn or change in bowel habits Skin: Denies abnormal skin rashes Lymphatics: Denies new lymphadenopathy or easy bruising Neurological:Denies numbness, tingling or new weaknesses Behavioral/Psych: Mood is stable, no new changes  All other systems were reviewed with the patient and are negative.  PHYSICAL EXAMINATION: ECOG PERFORMANCE STATUS: 0 - Asymptomatic  Filed Vitals:   12/20/14 1107  BP: 148/84  Pulse: 69  Temp: 98.4 F (36.9 C)  Resp: 18   Filed Weights   12/20/14 1107  Weight: 168 lb 8 oz (76.431 kg)    GENERAL:alert, no distress and comfortable SKIN: skin color, texture, turgor are normal, no rashes or significant lesions EYES: normal, conjunctiva are pink and non-injected, sclera clear OROPHARYNX:no exudate, no erythema and lips, buccal mucosa, and tongue normal  NECK: supple, thyroid normal size, non-tender,  without nodularity LYMPH:  no palpable lymphadenopathy in the cervical, axillary or inguinal LUNGS: clear to auscultation and percussion with normal breathing effort HEART: regular rate & rhythm and no murmurs and no lower extremity edema ABDOMEN:abdomen soft, non-tender and normal bowel sounds Musculoskeletal:no cyanosis of digits and no clubbing  PSYCH: alert & oriented x 3 with fluent speech NEURO: no focal motor/sensory deficits  LABORATORY DATA:  I have reviewed the data as listed CBC Latest Ref Rng 12/20/2014 09/20/2014 12/27/2008  WBC 3.9 - 10.3 10e3/uL 3.8(L) 2.8(L) 4.2  Hemoglobin 11.6 - 15.9 g/dL 14.0 14.4 13.9  Hematocrit 34.8 - 46.6 % 41.7 42.9 41.3  Platelets 145 - 400 10e3/uL 223 228 243    CMP Latest Ref Rng 09/20/2014 12/27/2008  Glucose 70 - 140 mg/dl 80 70  BUN 7.0 - 26.0 mg/dL 10.7 11  Creatinine 0.6 - 1.1 mg/dL 0.8 0.89  Sodium 136 - 145 mEq/L 141 139  Potassium 3.5 - 5.1 mEq/L 3.5 3.5  Chloride 96 - 112 mEq/L - 101  CO2 22 - 29 mEq/L 31(H) 31  Calcium 8.4 - 10.4 mg/dL 9.5 9.8  Total Protein 6.4 - 8.3 g/dL 7.8 -  Total Bilirubin 0.20 - 1.20 mg/dL 0.58 -  Alkaline Phos 40 - 150 U/L 74 -  AST 5 - 34 U/L 20 -  ALT 0 - 55 U/L 17 -   ANC today is 2.1   RADIOGRAPHIC STUDIES: I have personally reviewed the radiological images as listed and agreed with the findings in the report.  Ultrasound of Abdomen, complete 09/26/2014 IMPRESSION: Unremarkable abdominal ultrasound examination.   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  1. 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 The rest of CBC were normal. -Her repeated WBC 3.8 today with normal ANC 2.1.  -Her lab work reviewed normal J81, folic acid level, sedimentation rate and rheumatoid factor was negative, however her ANC was  positive with titer 1:160. This is weak positivity, probably not significant. She has no clinical signs of lupus or other rheumatoid disease symptoms. However she is really concerned, I will obtain double strength DNA and anti-Smith antibody to see if she has specific lupus antibody. If those are positive, her mild leukopenia could be related to her autoimmune process. -Her abdominal ultrasound was normal -Giving his mild neutropenia, normal hemoglobin and platelet, this is unlikely a primary bone marrow disease, I don't think she needs a bone marrow biopsy. -I'll follow-up her CBC, repeat in 6 months    2. History of rheumatic fever and rheumatic valve disease, status post aortic valve replacement -She'll continue follow-up with her cardiologist  -she is on Coumadin  Follow-up: -Lab today -I will see her back with repeated CBC and diff    All questions were answered. The patient knows to call the clinic with any problems, questions or concerns. I spent 20  minutes counseling the patient face to face. The total time spent in the appointment was 25 minutes and more than 50% was on counseling.     Truitt Merle, MD 12/20/2014 11:16 AM

## 2014-12-20 NOTE — Telephone Encounter (Signed)
GAVE AND PRINTED NEW SCHED...the patient OK AND AWARE

## 2014-12-23 ENCOUNTER — Telehealth: Payer: Self-pay | Admitting: Hematology

## 2014-12-23 LAB — ANTI-DNA ANTIBODY, DOUBLE-STRANDED: DS DNA AB: 1 [IU]/mL

## 2014-12-23 LAB — ANTI-SMITH ANTIBODY: ENA SM Ab Ser-aCnc: <1

## 2014-12-23 NOTE — Telephone Encounter (Signed)
I called her about her negative DsDNA and snti-smith antibodies test result. She voiced good understanding.   Erin Paul

## 2015-01-02 ENCOUNTER — Ambulatory Visit (INDEPENDENT_AMBULATORY_CARE_PROVIDER_SITE_OTHER): Payer: Commercial Managed Care - PPO | Admitting: Physician Assistant

## 2015-01-02 VITALS — BP 136/72 | HR 73 | Temp 98.2°F | Resp 18 | Ht 66.0 in | Wt 166.0 lb

## 2015-01-02 DIAGNOSIS — R0981 Nasal congestion: Secondary | ICD-10-CM

## 2015-01-02 DIAGNOSIS — J029 Acute pharyngitis, unspecified: Secondary | ICD-10-CM

## 2015-01-02 LAB — POCT RAPID STREP A (OFFICE): RAPID STREP A SCREEN: NEGATIVE

## 2015-01-02 MED ORDER — CETIRIZINE-PSEUDOEPHEDRINE ER 5-120 MG PO TB12: 1.0000 | ORAL_TABLET | ORAL | Status: AC

## 2015-01-02 MED ORDER — IBUPROFEN 600 MG PO TABS: 600.0000 mg | ORAL_TABLET | Freq: Three times a day (TID) | ORAL | Status: AC | PRN

## 2015-01-02 NOTE — Progress Notes (Signed)
01/03/2015 at 12:07 AM  Erin Paul / DOB: 1961/03/06 / MRN: [id number]  The patient  does not have a problem list on file.  SUBJECTIVE  Erin Paul is a 54 y.o. well appearing female presenting for the chief complaint of sore throat and coryza that has been 1.5 weeks in duration. Associates watery eyes with crusting in the morning along with mild irritation bilaterally.  No ear symptoms.  No fevers, chills.  Has tried Aleve, Zyrtec and Tylenol with some relief of her symptoms.  She is going to [place] next weeks and reports she needs to be well for that.  Denies cough and fever.      She  has a past medical history of Heart murmur.    Medications reviewed and updated by myself where necessary, and exist elsewhere in the encounter.   Erin Paul has No Known Allergies. She  reports that she has never smoked. She does not have any smokeless tobacco history on file. She reports that she drinks about 0.6 oz of alcohol per week. She reports that she does not use illicit drugs. She  has no sexual activity history on file. The patient  has past surgical history that includes Tubal ligation (1984) and Cardiac valve replacement (1999).  Her family history includes Cancer in her maternal grandmother; Diabetes in her father; Heart disease in her father and sister; Hypertension in her mother.  Review of Systems  Constitutional: Negative for fever and chills.  HENT: Positive for sore throat.   Respiratory: Negative for cough and shortness of breath.   Cardiovascular: Negative for chest pain.  Gastrointestinal: Negative for nausea and abdominal pain.  Genitourinary: Negative.   Skin: Negative for rash.  Neurological: Negative for dizziness and headaches.    OBJECTIVE  Her  height is  (1.676 m) and weight is 166 lb (75.297 kg). Her temperature is 98.2 F (36.8 C). Her blood pressure is 136/72 and her pulse is 73. Her respiration is 18 and oxygen saturation is 98%.  The patient's body  mass index is 26.81 kg/(m^2).  Physical Exam  Vitals reviewed. Constitutional: She is oriented to person, place, and time. She appears well-developed and well-nourished. No distress.  HENT:  Right Ear: Hearing, tympanic membrane, external ear and ear canal normal.  Left Ear: Hearing, tympanic membrane, external ear and ear canal normal.  Nose: Mucosal edema present. Right sinus exhibits no maxillary sinus tenderness and no frontal sinus tenderness. Left sinus exhibits no maxillary sinus tenderness and no frontal sinus tenderness.  Mouth/Throat: Uvula is midline, oropharynx is clear and moist and mucous membranes are normal.  Eyes: Pupils are equal, round, and reactive to light.  Cardiovascular: Normal rate, regular rhythm and normal heart sounds.   Respiratory: Effort normal and breath sounds normal. No respiratory distress. She has no wheezes. She has no rales.  GI: Soft. She exhibits no distension.  Musculoskeletal: Normal range of motion.  Neurological: She is alert and oriented to person, place, and time.  Skin: Skin is warm and dry. She is not diaphoretic.  Psychiatric: She has a normal mood and affect. Her behavior is normal. Judgment and thought content normal.    Results for orders placed or performed in visit on 01/02/15 (from the past 24 hour(s))  POCT rapid strep A     Status: None   Collection Time: 01/02/15  7:30 PM  Result Value Ref Range   Rapid Strep A Screen Negative Negative    ASSESSMENT & PLAN  Erin Paul was seen today for nasal congestion and sore throat.  Diagnoses and all orders for this visit:  Sore throat: Likely 2/2 post nasal drip. Will treat symptomatically for now and await culture results.  I do not think an antibiotic will help her symptoms.  Given the duration of this episode allergies is most likely, and problem 2 is 2/2 to post nasal drip.  She has been advised to call the clinic in three days if her symptoms do not improve.  Patient is taking  coumadin, however reports she tolerates Ibuprofen without difficulty.  INR to be checked in roughly 1 week by historical provider.   -     POCT rapid strep A -     Culture, Group A Strep -     cetirizine-pseudoephedrine (ZYRTEC-D ALLERGY & CONGESTION) 5-120 MG tablet; Take 1 tablet by mouth every morning. Do not use OTC cold remedies with this medication. -     ibuprofen (ADVIL,MOTRIN) 600 MG tablet; Take 1 tablet (600 mg total) by mouth every 8 (eight) hours as needed.  Sinus congestion -     cetirizine-pseudoephedrine (ZYRTEC-D ALLERGY & CONGESTION) 5-120 MG tablet; Take 1 tablet by mouth every morning. Do not use OTC cold remedies with this medication.    The patient was advised to call or come back to clinic if she does not see an improvement in symptoms, or worsens with the above plan.   [name], MHS, PA-C [facility] 01/03/2015 12:07 AM

## 2015-01-05 LAB — CULTURE, GROUP A STREP: ORGANISM ID, BACTERIA: NORMAL

## 2015-01-10 NOTE — Progress Notes (Signed)
  Medical screening examination/treatment/procedure(s) were performed by non-physician practitioner and as supervising physician I was immediately available for consultation/collaboration.     

## 2015-06-19 ENCOUNTER — Other Ambulatory Visit: Payer: BLUE CROSS/BLUE SHIELD

## 2015-06-19 ENCOUNTER — Ambulatory Visit: Payer: BLUE CROSS/BLUE SHIELD | Admitting: Hematology

## 2015-06-26 ENCOUNTER — Telehealth: Payer: Self-pay | Admitting: Hematology

## 2015-06-26 ENCOUNTER — Encounter: Payer: Self-pay | Admitting: Hematology

## 2015-06-26 ENCOUNTER — Encounter: Payer: BLUE CROSS/BLUE SHIELD | Admitting: Hematology

## 2015-06-26 ENCOUNTER — Other Ambulatory Visit: Payer: BLUE CROSS/BLUE SHIELD

## 2015-06-26 DIAGNOSIS — D72819 Decreased white blood cell count, unspecified: Secondary | ICD-10-CM | POA: Insufficient documentation

## 2015-06-26 NOTE — Telephone Encounter (Signed)
cld & no answer unable to leave a message

## 2015-06-26 NOTE — Progress Notes (Signed)
No show  This encounter was created in error - please disregard.

## 2017-01-29 ENCOUNTER — Encounter (HOSPITAL_COMMUNITY): Payer: Self-pay

## 2017-01-29 ENCOUNTER — Ambulatory Visit (HOSPITAL_COMMUNITY)
Admission: EM | Admit: 2017-01-29 | Discharge: 2017-01-29 | Disposition: A | Discharge status: Home / Self Care | Payer: PRIVATE HEALTH INSURANCE | Attending: Family Medicine | Admitting: Family Medicine

## 2017-01-29 DIAGNOSIS — M62838 Other muscle spasm: Secondary | ICD-10-CM

## 2017-01-29 MED ORDER — HYDROCODONE-ACETAMINOPHEN 5-325 MG PO TABS: 1.0000 | ORAL_TABLET | Freq: Four times a day (QID) | ORAL | 0 refills | Status: AC | PRN

## 2017-01-29 MED ORDER — CYCLOBENZAPRINE HCL 10 MG PO TABS: 10.0000 mg | ORAL_TABLET | Freq: Three times a day (TID) | ORAL | 0 refills | Status: AC

## 2017-01-29 NOTE — ED Triage Notes (Signed)
Pt presents today with left side pain that she describes as a muscle "twinge" if she moves a certain way the pain gets very intense. Started this morning when she woke up. Has not gotten any better since it started.

## 2017-01-31 NOTE — ED Provider Notes (Signed)
Erin Paul   [id number] 01/29/17 Arrival Time: 1847  ASSESSMENT & PLAN:  1. Muscle spasm     Meds ordered this encounter  Medications  . cyclobenzaprine (FLEXERIL) 10 MG tablet    Sig: Take 1 tablet (10 mg total) by mouth 3 (three) times daily.    Dispense:  20 tablet    Refill:  0  . HYDROcodone-acetaminophen (NORCO/VICODIN) 5-325 MG tablet    Sig: Take 1 tablet by mouth every 6 (six) hours as needed for moderate pain or severe pain.    Dispense:  10 tablet    Refill:  0   Medication sedation precautions. Ensure mobility. Will f/u with PCP if not showing improvement over the next 48-72 hours. May f/u here as needed. Reviewed expectations re: course of current medical issues. Questions answered. Outlined signs and symptoms indicating need for more acute intervention. Patient verbalized understanding. After Visit Summary given.  SUBJECTIVE:  Erin Paul is a 56 y.o. female who presents with complaint of low back discomfort. Onset abrupt beginning today. Question if related to recent pushing of heavy objects. L-sided. No pain at rest but certain movements produce an "intense" and "sharp" localized pain to her L lower back. No extremity sensation changes or weakness. Normal bowel/bladder habits. OTC ibuprofen without much relief. No h/o similar symptoms reported. Pain does not radiate.  ROS: As per HPI.   OBJECTIVE:  Vitals:   01/29/17 1940  BP: (!) 145/86  Pulse: 67  Resp: 16  Temp: 97.9 F (36.6 C)  TempSrc: Oral  SpO2: 97%    General appearance: alert; no distress Abdomen: soft, non-tender; bowel sounds normal; no masses or organomegaly; no guarding or rebound tenderness Back: poorly localized tenderness to L lower paraspinal musculature; no midline tenderness Extremities: no cyanosis or edema; symmetrical with no gross deformities Skin: warm and dry Neurologic: normal gait; normal symmetric reflexes; normal LE strength and sensation Psychological:  alert and cooperative; normal mood and affect  No Known Allergies  Past Medical History:  Diagnosis Date  . Heart murmur    Social History   Socioeconomic History  . Marital status: Single    Spouse name: Not on file  . Number of children: Not on file  . Years of education: Not on file  . Highest education level: Not on file  Social Needs  . Financial resource strain: Not on file  . Food insecurity - worry: Not on file  . Food insecurity - inability: Not on file  . Transportation needs - medical: Not on file  . Transportation needs - non-medical: Not on file  Occupational History  . Not on file  Tobacco Use  . Smoking status: Never Smoker  . Smokeless tobacco: Never Used  Substance and Sexual Activity  . Alcohol use: Yes    Alcohol/week: 0.6 oz    Types: 1 Glasses of wine per week    Comment: wine 3 times a week  . Drug use: Yes    Types: Morphine  . Sexual activity: Not on file  Other Topics Concern  . Not on file  Social History Narrative  . Not on file   Family History  Problem Relation Age of Onset  . Diabetes Father   . Heart disease Father   . Hypertension Mother   . Heart disease Sister   . Cancer Maternal Grandmother        multiple myeloma    Past Surgical History:  Procedure Laterality Date  . CARDIAC VALVE REPLACEMENT  Erin     Paul Kick, MD 01/31/17 1011

## 2017-05-07 IMAGING — US US ABDOMEN COMPLETE
1 series · 14 of 25 positions shown · non-contrast
Comparison: None.

CLINICAL DATA: Leukopenia.

EXAM:
ULTRASOUND ABDOMEN COMPLETE

[Series 1: us abdomen complete · 0.17mm/px · 14 of 81 slices shown]
[im 1/81]
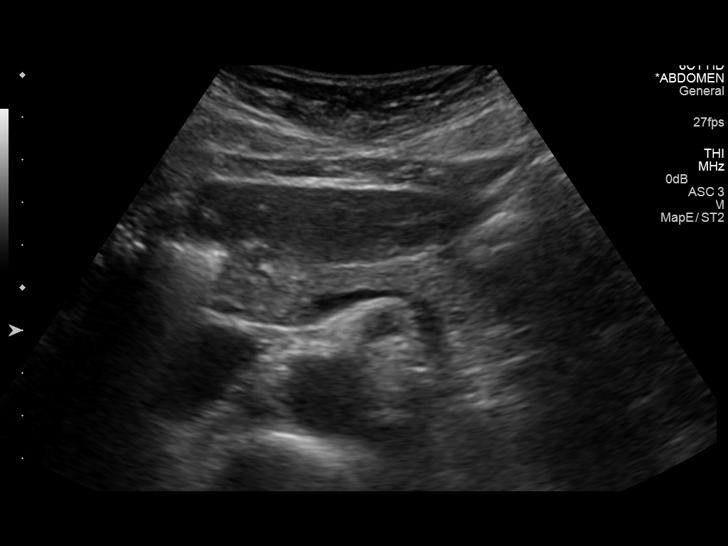
[im 7/81]
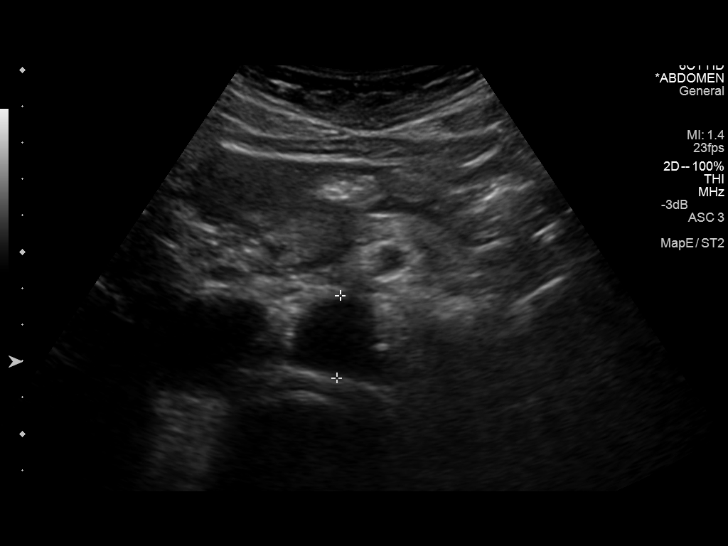
[im 14/81]
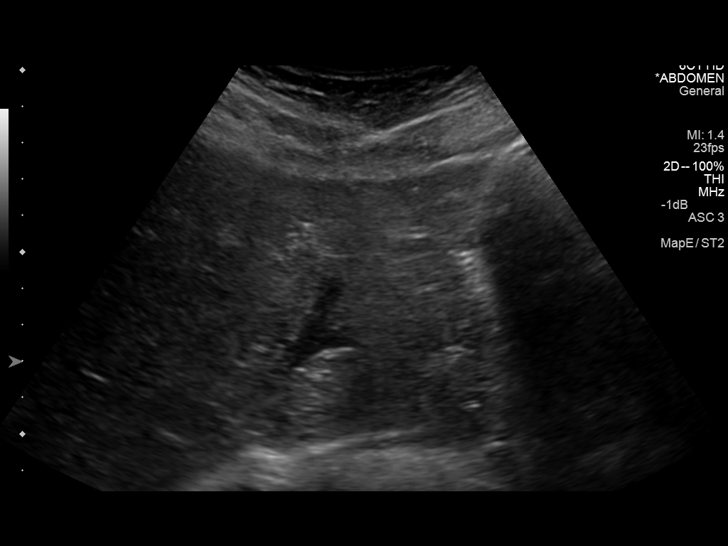
[im 21/81]
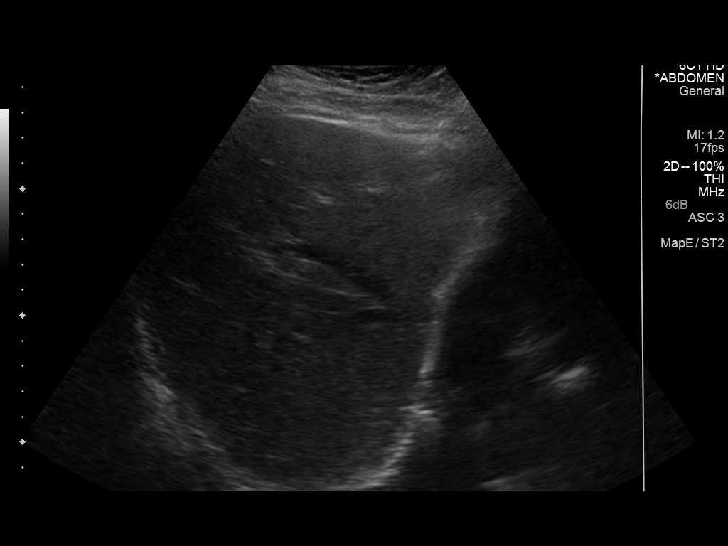
[im 27/81]
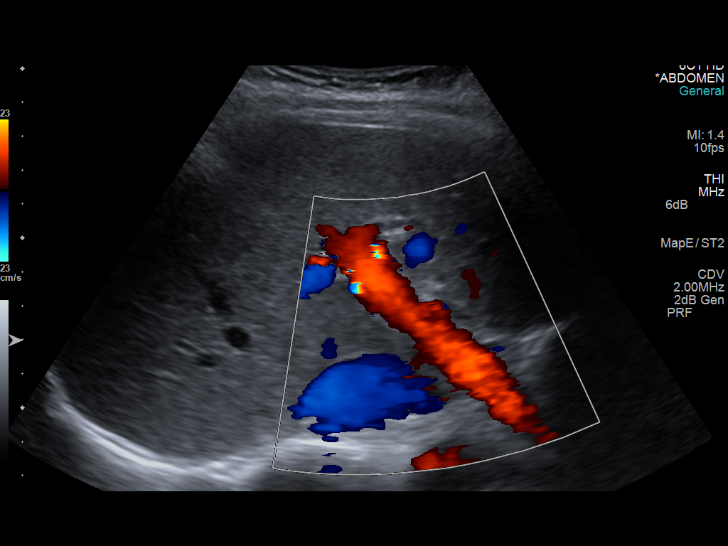
[im 31/81]
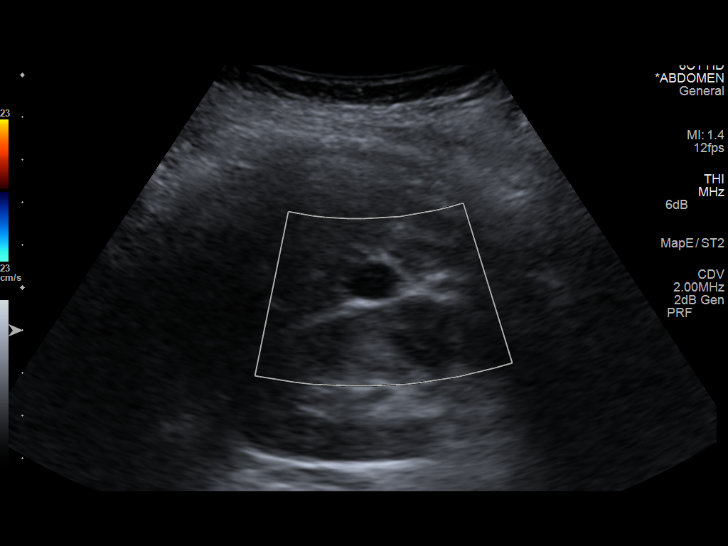
[im 37/81]
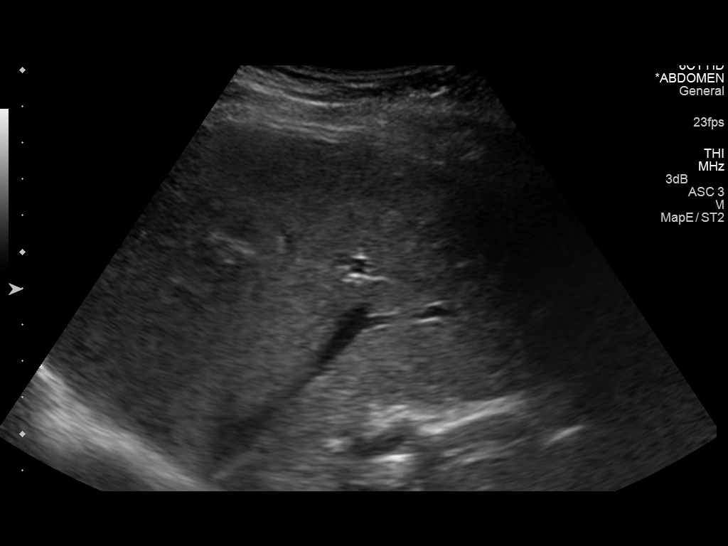
[im 44/81]
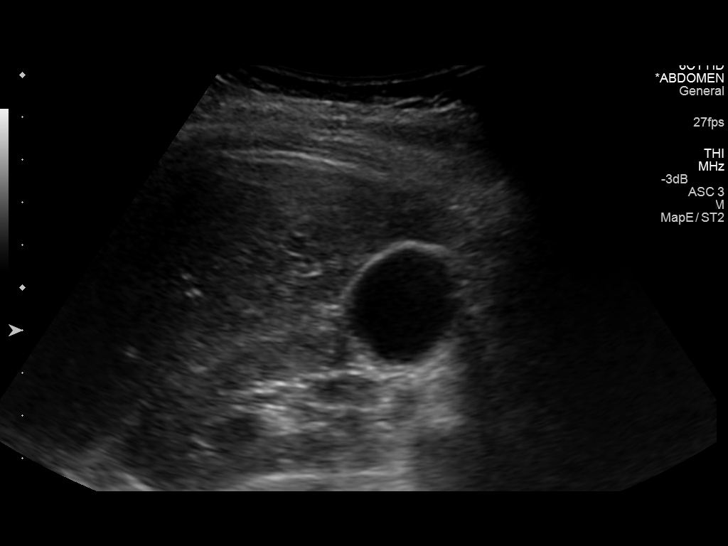
[im 51/81]
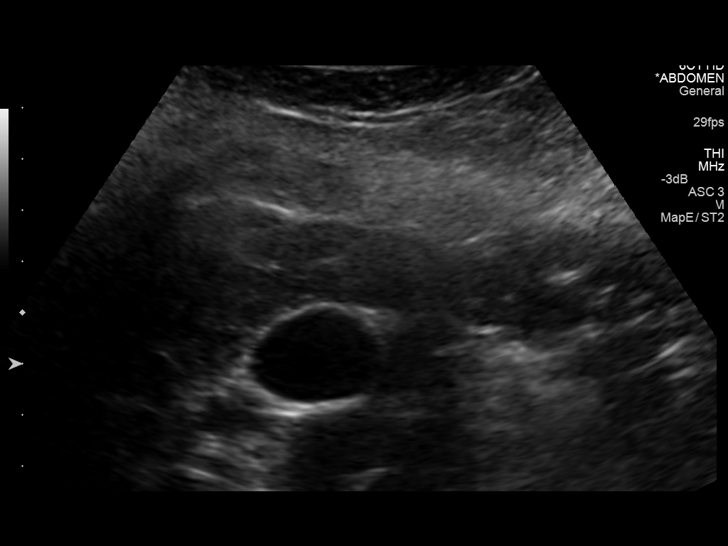
[im 54/81]
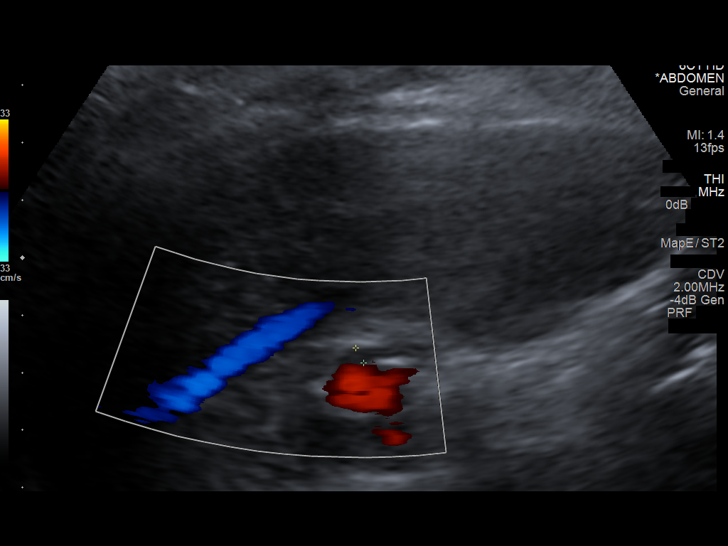
[im 61/81]
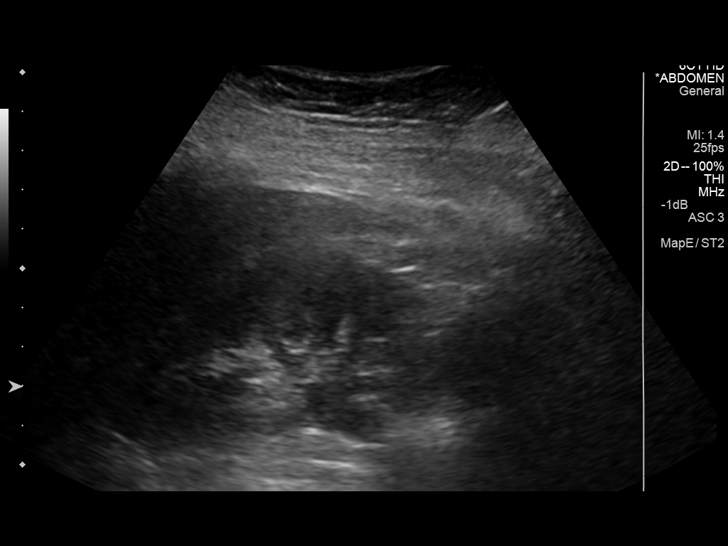
[im 67/81]
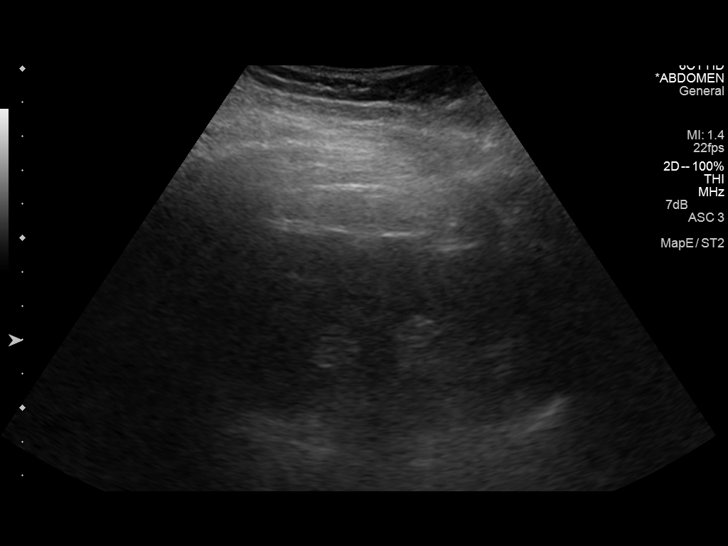
[im 74/81]
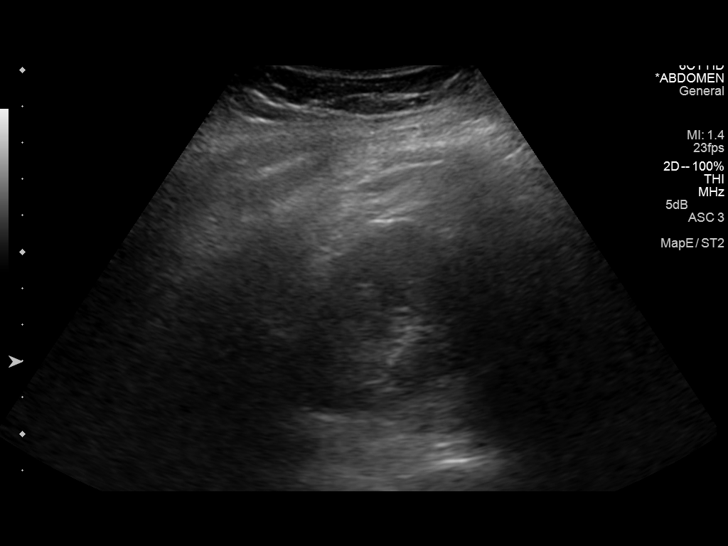
[im 81/81]
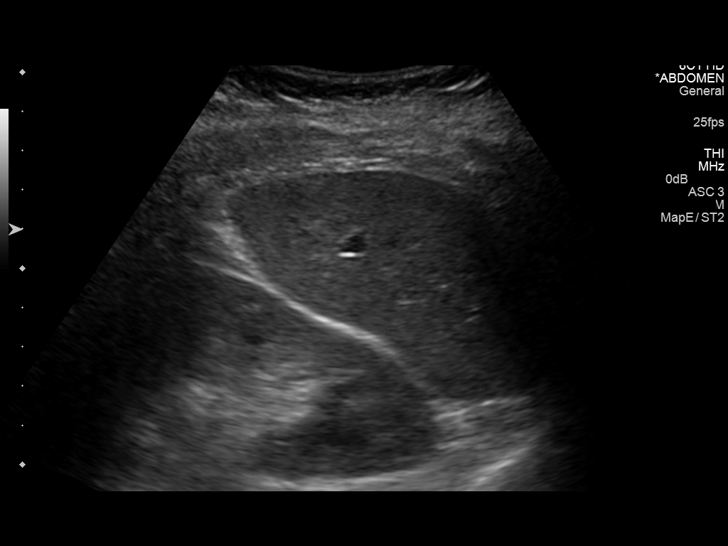

[14 of 25 positions shown; findings below may reference images not displayed]

FINDINGS: Gallbladder: No gallstones or wall thickening visualized. No
sonographic Murphy sign noted.

Common bile duct: Diameter: 3.0 mm

Liver: Normal echogenicity and no intrahepatic biliary dilatation.
There is a simple appearing hepatic cysts noted near the hepatic
renal fossa which measures a maximum of 1.3 cm

IVC: Normal caliber

Pancreas: Sonographically normal

Spleen: Normal size and echogenicity without focal lesions

Right Kidney: Length: 9.5 cm. Normal renal cortical thickness and
echogenicity without focal lesions or hydronephrosis.

Left Kidney: Length: 10.9 cm. Normal renal cortical thickness and
echogenicity without focal lesions or hydronephrosis.

Abdominal aorta: Normal caliber

Other findings: No ascites
IMPRESSION: Unremarkable abdominal ultrasound examination.

## 2019-10-02 ENCOUNTER — Other Ambulatory Visit: Payer: Self-pay | Admitting: Cardiology

## 2019-10-04 ENCOUNTER — Other Ambulatory Visit: Payer: Self-pay | Admitting: Cardiology

## 2019-10-04 NOTE — Telephone Encounter (Signed)
Hi, I dont think she has ever been to our office and I havent seen her for anticoagulation management. May have been sent to our office on accident

## 2019-10-04 NOTE — Telephone Encounter (Signed)
refill
